# Patient Record
Sex: Female | Born: 1980 | Race: White | Hispanic: No | Marital: Married | Smoking: Never smoker
Health system: Southern US, Community
[De-identification: ages and names within clinical notes are randomized; demographics above are authoritative.]

---

## 1998-07-29 ENCOUNTER — Other Ambulatory Visit: Admission: RE | Admit: 1998-07-29 | Discharge: 1998-07-29 | Payer: Self-pay | Admitting: Obstetrics & Gynecology

## 2000-04-12 ENCOUNTER — Other Ambulatory Visit: Admission: RE | Admit: 2000-04-12 | Discharge: 2000-04-12 | Payer: Self-pay | Admitting: *Deleted

## 2000-07-19 ENCOUNTER — Encounter: Payer: Self-pay | Admitting: Obstetrics & Gynecology

## 2000-07-19 ENCOUNTER — Inpatient Hospital Stay (HOSPITAL_COMMUNITY): Admission: AD | Admit: 2000-07-19 | Discharge: 2000-07-19 | Payer: Self-pay | Admitting: Obstetrics & Gynecology

## 2000-11-16 ENCOUNTER — Inpatient Hospital Stay (HOSPITAL_COMMUNITY): Admission: AD | Admit: 2000-11-16 | Discharge: 2000-11-18 | Payer: Self-pay | Admitting: Obstetrics & Gynecology

## 2001-12-20 ENCOUNTER — Other Ambulatory Visit: Admission: RE | Admit: 2001-12-20 | Discharge: 2001-12-20 | Payer: Self-pay | Admitting: Obstetrics and Gynecology

## 2003-04-11 ENCOUNTER — Other Ambulatory Visit: Admission: RE | Admit: 2003-04-11 | Discharge: 2003-04-11 | Payer: Self-pay | Admitting: Obstetrics and Gynecology

## 2004-05-08 ENCOUNTER — Other Ambulatory Visit: Admission: RE | Admit: 2004-05-08 | Discharge: 2004-05-08 | Payer: Self-pay | Admitting: Obstetrics and Gynecology

## 2005-02-25 ENCOUNTER — Emergency Department (HOSPITAL_COMMUNITY): Admission: EM | Admit: 2005-02-25 | Discharge: 2005-02-26 | Payer: Self-pay | Admitting: *Deleted

## 2005-06-03 ENCOUNTER — Other Ambulatory Visit: Admission: RE | Admit: 2005-06-03 | Discharge: 2005-06-03 | Payer: Self-pay | Admitting: Obstetrics and Gynecology

## 2006-05-19 ENCOUNTER — Ambulatory Visit: Payer: Self-pay | Admitting: Gastroenterology

## 2006-06-09 ENCOUNTER — Ambulatory Visit: Payer: Self-pay | Admitting: Gastroenterology

## 2006-06-09 ENCOUNTER — Encounter (INDEPENDENT_AMBULATORY_CARE_PROVIDER_SITE_OTHER): Payer: Self-pay | Admitting: Specialist

## 2006-06-14 ENCOUNTER — Ambulatory Visit: Payer: Self-pay | Admitting: Internal Medicine

## 2006-07-12 ENCOUNTER — Ambulatory Visit: Payer: Self-pay | Admitting: Gastroenterology

## 2008-02-11 IMAGING — CT CT ABDOMEN W/ CM
2 of 5 series · 16 of 46 positions shown, 18 images · IV contrast (omnipaque)
Comparison: Plain film 02/26/05.

CLINICAL DATA: Chest pain and epigastric pain for one year.  Worsening.  Occasional shortness of breath.
CHEST CT WITH CONTRAST:
TECHNIQUE: Multidetector CT imaging of the chest was performed following the standard protocol during bolus administration of intravenous contrast.
Contrast:  100 cc Omnipaque 300.
TECHNIQUE: Multidetector CT imaging of the abdomen was performed following the standard protocol during bolus administration of intravenous contrast.

[Series 2: ca 5.0 b40f st · axial · 0.56mm/px · z∈[+1036,+1400]mm · 13 of 85 slices shown, 15 images]
[im 6/85  soft-tissue]
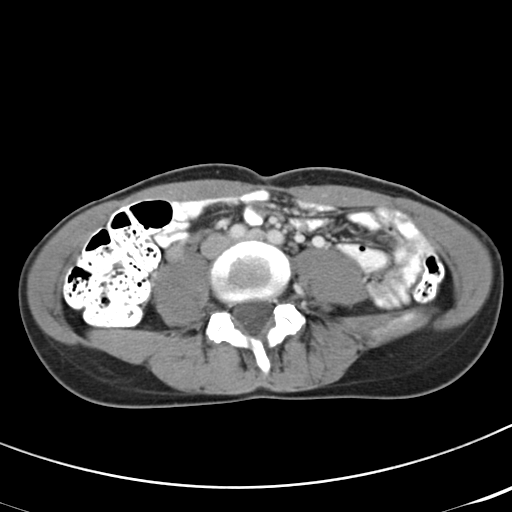
[im 6/85  bone]
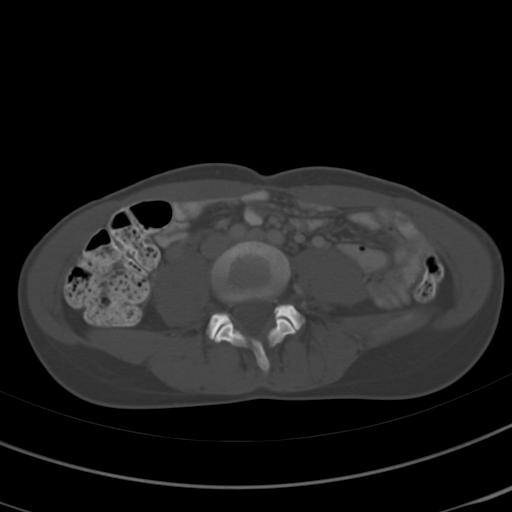
[im 12/85  soft-tissue]
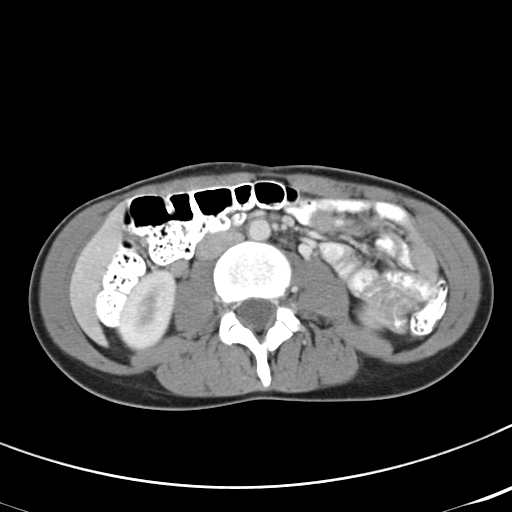
[im 17/85  soft-tissue]
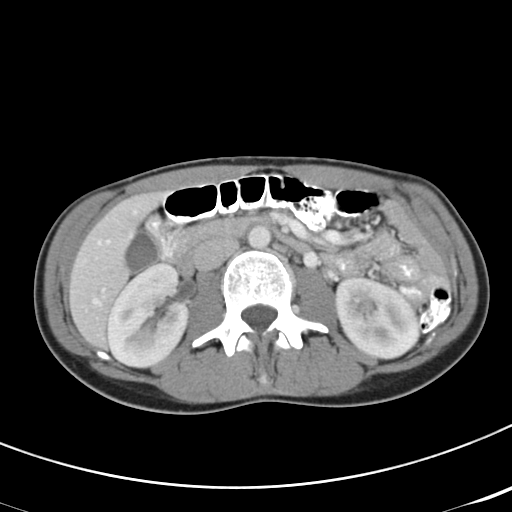
[im 23/85  soft-tissue]
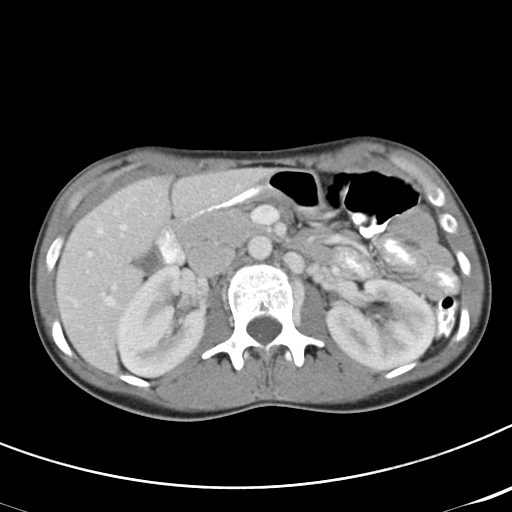
[im 29/85  soft-tissue]
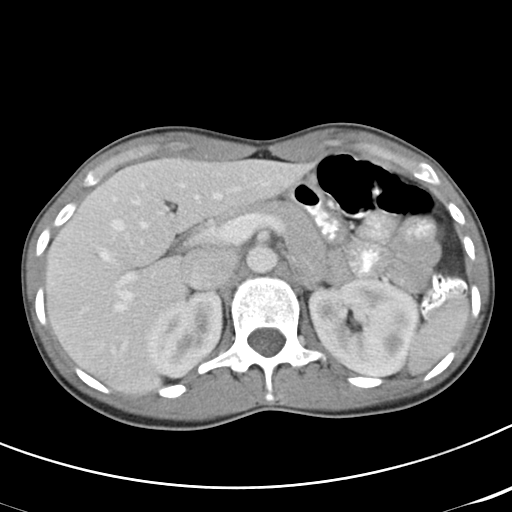
[im 34/85  soft-tissue]
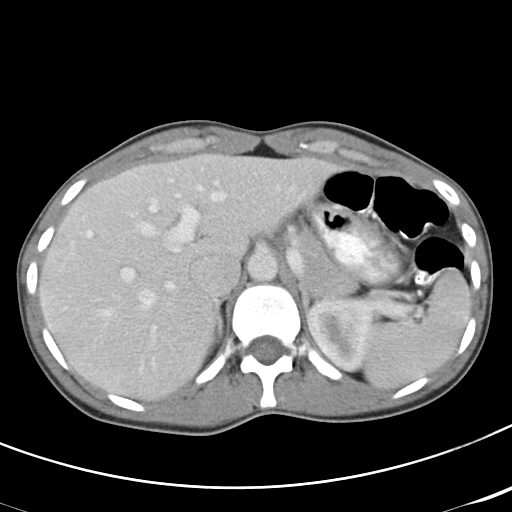
[im 45/85  soft-tissue]
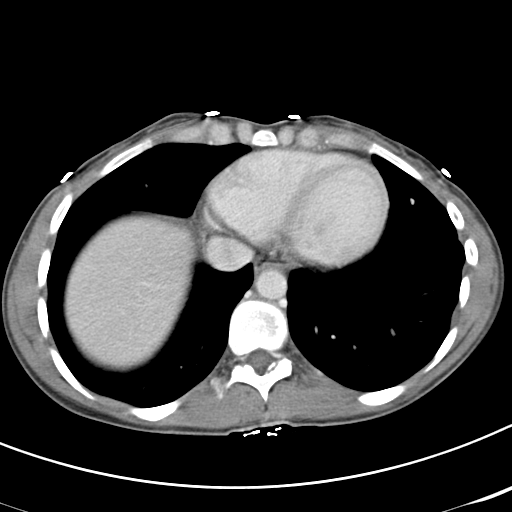
[im 51/85  soft-tissue]
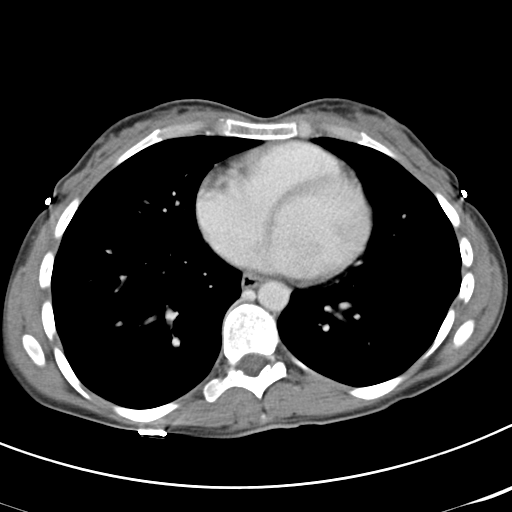
[im 57/85  soft-tissue]
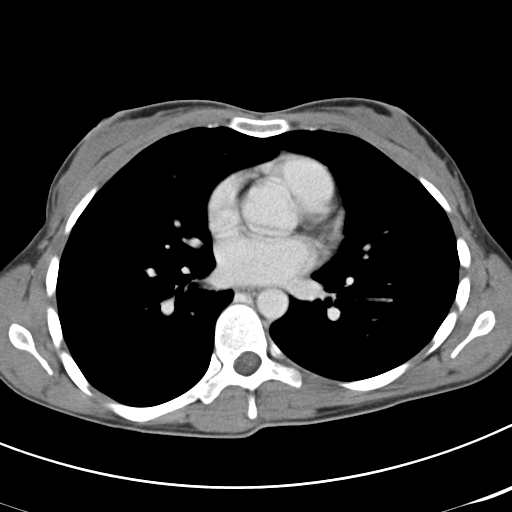
[im 57/85  bone]
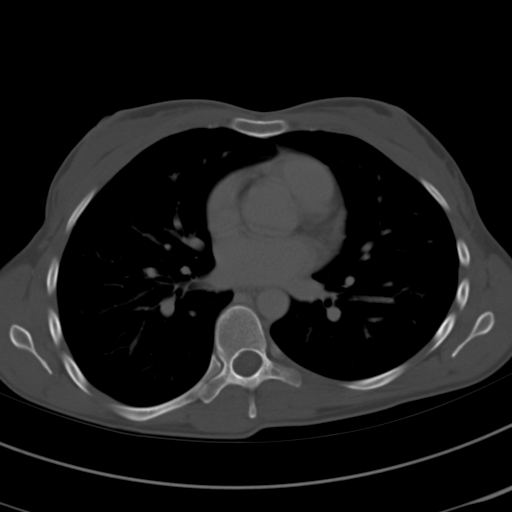
[im 62/85  soft-tissue]
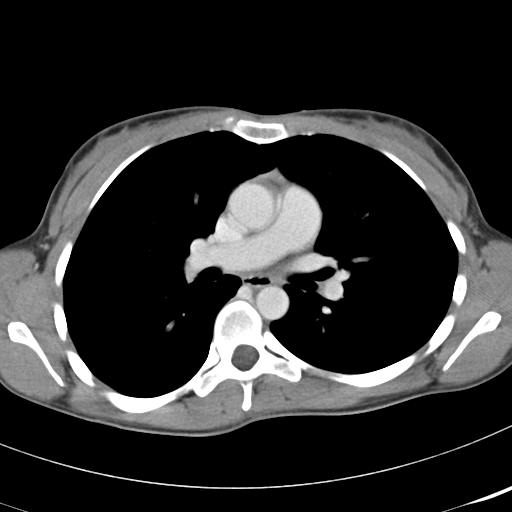
[im 68/85  soft-tissue]
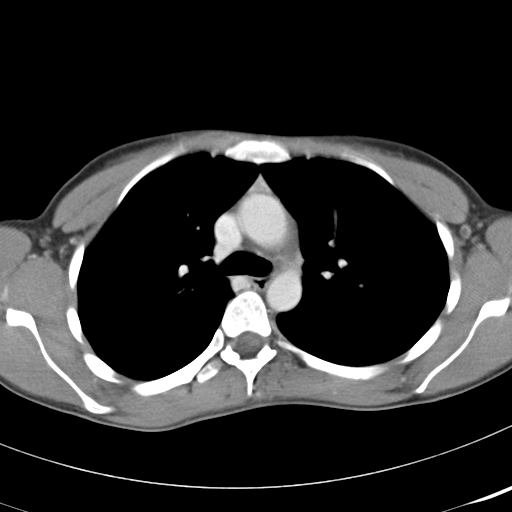
[im 73/85  soft-tissue]
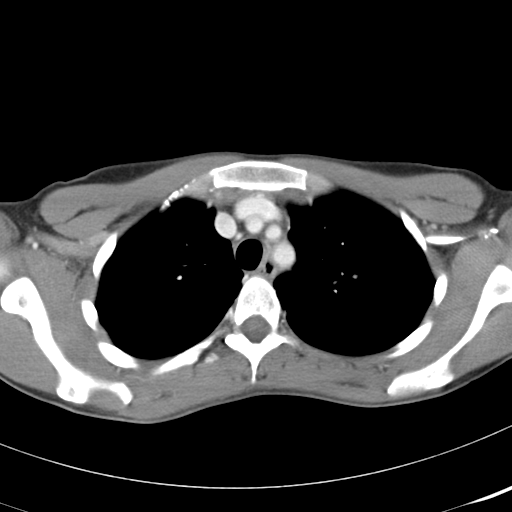
[im 79/85  soft-tissue]
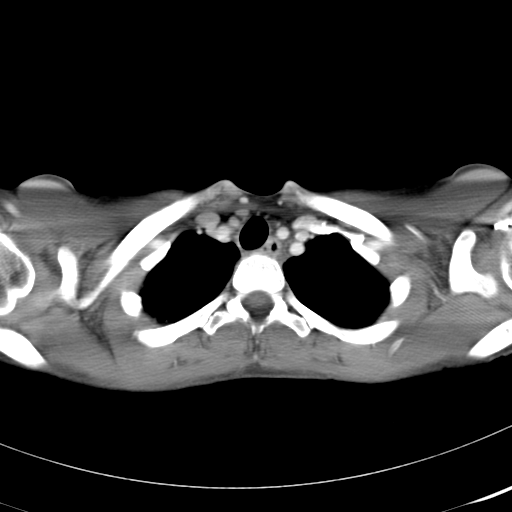

[Series 602: <mpr range> · coronal · 0.87mm/px · 3 of 48 slices shown]
[im 16/48  soft-tissue]
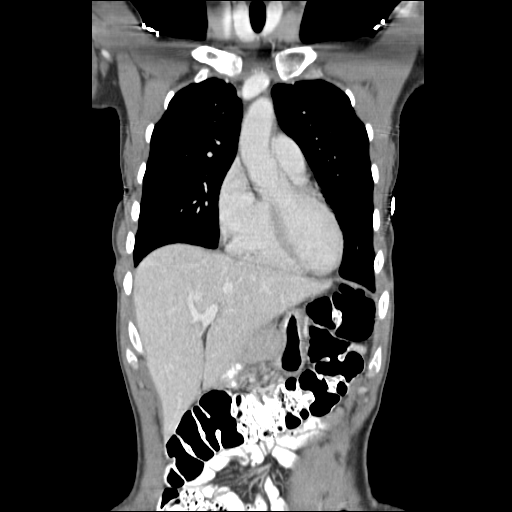
[im 21/48  soft-tissue]
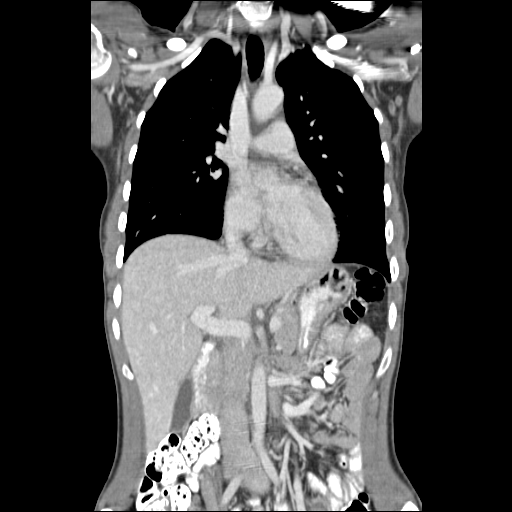
[im 27/48  soft-tissue]
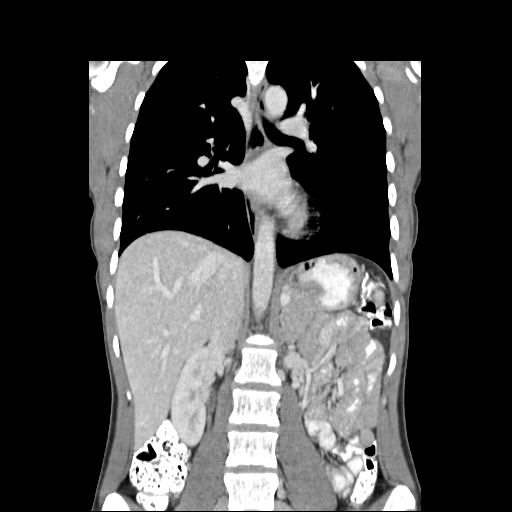

[16 of 46 positions shown; findings below may reference images not displayed]

FINDINGS: Lung windows demonstrate mild motion artifact in the upper chest on image #17 through approximately image #22.   No nodules.   No airspace disease.   
Soft tissue windows demonstrate normal caliber of the thoracic aorta without evidence of dissection.   Normal heart size.   Mild pectus excavatum deformity.   No pericardial or pleural effusion.   No mediastinal or hilar adenopathy.   
The esophagus is normal without evidence of hiatal hernia or esophageal wall thickening.   Minimal anterior mediastinal soft tissue is most consistent with residual thymus.
IMPRESSION: 1.  No acute process in the chest. 
2.  Mild pectus excavatum deformity.  Spinal curvature is mild and convex rightward.  
3.  Age-advanced osteophytes in the thoracic spine as described.  Correlate with whether patient?s symptoms could be spinal-based in nature.
ABDOMEN CT WITH CONTRAST:
FINDINGS: In the posterior RIGHT hepatic lobe, there is an approximately 6 mm hypoattenuating lesion which is too small to entirely characterize.
The spleen, stomach, pancreas, gallbladder, adrenal glands, and kidneys are normal.  There is no retroperitoneal or retrocrural adenopathy.  Abdominal large and small bowel loops are within normal limits.  The terminal ileum is not imaged.  There is suggestion of mild prominence of the gonadal veins.  No ascites.  
Bone windows demonstrate mild osseous irregularity in the right paracentral spine at T8 and in the LEFT paracentral spine at T10.  Convex right spinal curvature.
IMPRESSION: 1.  No acute process in the abdomen.
2.  Too small to characterize hypoattenuating right hepatic lobe lesion.  Presuming no history of primary malignancy or liver disease, this most likely represents a small hemangioma or cyst.
3.  Prominent gonadal veins are non-specific, but can be seen with pelvic congestion syndrome.

## 2009-01-02 ENCOUNTER — Inpatient Hospital Stay (HOSPITAL_COMMUNITY): Admission: AD | Admit: 2009-01-02 | Discharge: 2009-01-02 | Payer: Self-pay | Admitting: Obstetrics and Gynecology

## 2009-04-03 ENCOUNTER — Encounter (INDEPENDENT_AMBULATORY_CARE_PROVIDER_SITE_OTHER): Payer: Self-pay | Admitting: Obstetrics and Gynecology

## 2009-04-03 ENCOUNTER — Inpatient Hospital Stay (HOSPITAL_COMMUNITY): Admission: AD | Admit: 2009-04-03 | Discharge: 2009-04-05 | Payer: Self-pay | Admitting: Obstetrics and Gynecology

## 2010-11-17 ENCOUNTER — Inpatient Hospital Stay (HOSPITAL_COMMUNITY)
Admission: AD | Admit: 2010-11-17 | Discharge: 2010-11-17 | Disposition: A | Discharge status: Home / Self Care | Payer: BC Managed Care – PPO | Source: Ambulatory Visit | Attending: Obstetrics and Gynecology | Admitting: Obstetrics and Gynecology

## 2010-11-17 DIAGNOSIS — Z2989 Encounter for other specified prophylactic measures: Secondary | ICD-10-CM | POA: Insufficient documentation

## 2010-11-17 DIAGNOSIS — Z298 Encounter for other specified prophylactic measures: Secondary | ICD-10-CM | POA: Insufficient documentation

## 2010-11-17 DIAGNOSIS — Z3480 Encounter for supervision of other normal pregnancy, unspecified trimester: Secondary | ICD-10-CM | POA: Insufficient documentation

## 2010-11-18 LAB — RH IMMUNE GLOBULIN WORKUP (NOT WOMEN'S HOSP)
ABO/RH(D): A NEG
Antibody Screen: NEGATIVE
Unit division: 0

## 2010-12-19 ENCOUNTER — Inpatient Hospital Stay (HOSPITAL_COMMUNITY)
Admission: AD | Admit: 2010-12-19 | Discharge: 2010-12-20 | DRG: 373 | Disposition: A | Discharge status: Home / Self Care | Payer: BC Managed Care – PPO | Source: Ambulatory Visit | Attending: Obstetrics and Gynecology | Admitting: Obstetrics and Gynecology

## 2010-12-19 ENCOUNTER — Other Ambulatory Visit: Payer: Self-pay | Admitting: Obstetrics and Gynecology

## 2010-12-19 DIAGNOSIS — O34219 Maternal care for unspecified type scar from previous cesarean delivery: Principal | ICD-10-CM | POA: Diagnosis present

## 2010-12-19 DIAGNOSIS — O9903 Anemia complicating the puerperium: Secondary | ICD-10-CM | POA: Diagnosis not present

## 2010-12-19 DIAGNOSIS — D649 Anemia, unspecified: Secondary | ICD-10-CM | POA: Diagnosis not present

## 2010-12-19 LAB — CBC
HCT: 34.2 % — ABNORMAL LOW (ref 36.0–46.0)
Hemoglobin: 11 g/dL — ABNORMAL LOW (ref 12.0–15.0)
MCH: 28.4 pg (ref 26.0–34.0)
MCHC: 32.2 g/dL (ref 30.0–36.0)
MCV: 88.4 fL (ref 78.0–100.0)
Platelets: 176 10*3/uL (ref 150–400)
RBC: 3.87 MIL/uL (ref 3.87–5.11)
RDW: 14.7 % (ref 11.5–15.5)
WBC: 7.7 10*3/uL (ref 4.0–10.5)

## 2010-12-19 LAB — RPR: RPR Ser Ql: NONREACTIVE

## 2010-12-20 ENCOUNTER — Inpatient Hospital Stay (HOSPITAL_COMMUNITY): Admission: AD | Admit: 2010-12-20 | Payer: Self-pay | Admitting: Obstetrics and Gynecology

## 2010-12-20 LAB — CBC
HCT: 27.1 % — ABNORMAL LOW (ref 36.0–46.0)
MCV: 88.9 fL (ref 78.0–100.0)
RBC: 3.05 MIL/uL — ABNORMAL LOW (ref 3.87–5.11)
WBC: 10.2 10*3/uL (ref 4.0–10.5)

## 2010-12-21 LAB — CBC
HCT: 30 % — ABNORMAL LOW (ref 36.0–46.0)
HCT: 33.8 % — ABNORMAL LOW (ref 36.0–46.0)
Hemoglobin: 11.9 g/dL — ABNORMAL LOW (ref 12.0–15.0)
MCHC: 34 g/dL (ref 30.0–36.0)
MCHC: 35.2 g/dL (ref 30.0–36.0)
MCV: 99.1 fL (ref 78.0–100.0)
Platelets: 160 10*3/uL (ref 150–400)
RBC: 3.02 MIL/uL — ABNORMAL LOW (ref 3.87–5.11)
RDW: 13.5 % (ref 11.5–15.5)

## 2010-12-21 LAB — RH IMMUNE GLOB WKUP(>/=20WKS)(NOT WOMEN'S HOSP)

## 2010-12-24 LAB — RH IMMUNE GLOBULIN WORKUP (NOT WOMEN'S HOSP)
ABO/RH(D): A NEG
Antibody Screen: NEGATIVE

## 2011-01-27 NOTE — H&P (Signed)
NAMEKIERAN, NACHTIGAL             ACCOUNT NO.:  [id number]   MEDICAL RECORD NO.:  [id number]          PATIENT TYPE:  INP   LOCATION:  9131                          FACILITY:  [facility]   PHYSICIAN:  Crist Fat. Rivard, M.D. DATE OF BIRTH:  11-Jun-1981   DATE OF ADMISSION:  04/03/2009  DATE OF DISCHARGE:                              HISTORY & PHYSICAL   [name] is a 30 year old gravida 3, para 2-0-0-2 at 41-3/7 weeks who  presents today in early labor.  She reports onset of contractions at  approximately 1 p.m. today.  She denies any leaking or bleeding.  She  reports positive fetal movement.  Pregnancy has been remarkable for:  1. History of a previous C-section with a subsequent VBAC with desire      for VBAC this time.  2. History of gestational diabetes in previous pregnancy.  3. History of LGA infant with her last baby weighing 9 pounds 9      ounces.  4. History of preterm labor, but term delivery.  5. Rh negative.  6. Rubella negative.  7. Group B strep negative.   PRENATAL LABS:  Blood type is A negative, Rh antibody negative, VDRL  nonreactive, rubella titer is nonimmune.  Hepatitis B surface antigen  negative, HIV is nonreactive.  GC and chlamydia cultures were declined.  Pap was due in approximately April, but I do not see at this time where  it was performed in April.  The patient had a normal quadruple screen.  She had a 1-hour Glucola at 19 weeks which was elevated.  She had a  normal 3-hour GTT.  She then had a repeat 3-hour GTT at 28 weeks and it  was normal.  Her hemoglobin upon entry into practice was 12.0, it was  11.4 and 28 weeks.  Group B strep culture is negative at 36 weeks.   HISTORY OF PRESENT PREGNANCY:  The patient entered care at 10 weeks.  She had a quadruple screen planned.  She desired a VBAC.  Consent form  was signed in May.  She had some pain at 13 weeks.  She had an  ultrasound at 15 weeks secondary to size greater than dates that gave  her an [facility]  of March 25, 2009.  She had an early Glucola that was elevated  at 160.  She had a normal 3-hour GTT.  She had a normal ultrasound at 18  weeks with an anterior placenta noted.  VBAC consent form was signed in  May.  Due to her history of gestational diabetes with her previous  pregnancy and her elevated 1-hour, but normal 3-hour at 19 weeks, she  was given a 3-hour again at 27 weeks which was normal.  Group B strep  culture was negative at 36 weeks.  At 40-4/7 weeks, she was 2 cm.  Membranes were swept.  At 41 weeks, her cervix was unchanged.  She  declined membrane sweeping and induction at that time.  NST was reactive  in 41 weeks.  A repeat NST and AFI were done on April 04, 2009, which  were within normal limits.  She then presents today in early labor.   OBSTETRICAL HISTORY:  In 1998, she had a primary low transverse cesarean  section for a female infant, weight 6 pounds 6 ounces at 37 weeks  secondary to previa and labor onset at 37 weeks.  In 2002, she had a  vaginal VBAC of a female infant, weight 9 pounds 9 ounces of 42 weeks.  She was in labor 11 hours.  She had epidural anesthesia.  She was  treated as gestational diabetic with her first pregnancy secondary to  not being able to keep down the Glucola.  She did have a placenta previa  with that first pregnancy.   MEDICAL HISTORY:  Blood type is A negative.  She has been on Alesse in  the past.  The only other surgery was C-section in 1998.   ALLERGIES:  SHE HAS NO KNOWN MEDICATION ALLERGIES.   FAMILY HISTORY:  Paternal grandmother has adult onset diabetes.  Paternal grandfather had kidney cancer.   GENETIC HISTORY:  Remarkable for the mother of the baby's paternal aunt  and paternal uncle are twins.   SOCIAL HISTORY:  The patient is married to the father of baby.  He is  involved and supportive, his name is Felicia Morris.  The patient is  Caucasian.  She denies a religious affiliation.  She has a bachelor's  degree.  She is  employed as a Runner, broadcasting/film/video.  Her husband has a high school  education.  He is employed in Counsellor.  She has been followed by the  certified nurse midwife service at [facility].  She denies any  alcohol, drug or tobacco use during this pregnancy.   PHYSICAL EXAMINATION:  VITAL SIGNS:  Stable.  The patient is febrile.  HEENT:  Within normal limits.  LUNGS:  Breath sounds are clear.  HEART:  Regular rate and rhythm without murmur.  BREASTS:  Soft and nontender.  ABDOMEN:  Fundal height is approximately 39 weeks' size with estimated  fetal weight 8 to 8-1/2 pounds.  Uterine contractions are every 2-4  minutes, moderate quality. Cervix is 3+, 90%, vertex at a -1 station  with slight bulging bag of water.  Fetal heart rate is reactive with no  decelerations present at this time.  EXTREMITIES:  Deep tendon reflexes are 2+ without clonus.  There is  trace edema noted.   IMPRESSION:  1. Intrauterine pregnancy at 41-3/7 weeks.  2. Early labor.  3. Negative group B strep.  4. Previous cesarean section with a subsequent vaginal birth after      cesarean with desire for vaginal birth after cesarean this time.  5. Negative group B strep.  6. Rh negative.   PLAN:  1. Admit to birthing suite for consult with Dr. Silverio Lay as      attending physician.  2. Routine certified nurse midwife orders.  3. The patient at present prefers to decline epidural, however, she      may decide to proceed with this as labor progresses.  4. The patient understands the risks and benefits of VBAC, including      uterine rupture, lack of progress requiring a repeat cesarean,      history of LGA infant, possibility of shoulder dystocia.  The      patient seemed to understand all these risks and others and wishes      to proceed with VBAC trial.      Chip Boer L. Emilee Hero, C.N.M.      Crist Fat Rivard,  M.D.  Electronically Signed   VLL/MEDQ  D:  04/04/2009  T:  04/04/2009  Job:  [id number]

## 2011-01-30 NOTE — Assessment & Plan Note (Signed)
[facility]                           GASTROENTEROLOGY OFFICE NOTE   NAME:JARRELLShaianne, Nucci                    MRN:          [id number]  DATE:05/19/2006                            DOB:          1981-09-07    REASON FOR REFERRAL:  Dr. Christell Constant asked me to evaluate Ms. Bardon in  consultation regarding globus sensation, dysphagia, chest pain, and  abdominal pain.   Ms. Babich is a very pleasant 30 year old woman who has had a constellation  of GI symptoms for approximately one year.  First, she has intermittent  solid food dysphagia.  She has never had overt food impaction but does feel  that, at times, food does seem to hang-up in her mid esophagus.  Liquids  have no troubles.  Secondly, she has a feeling of a lump in her throat.  This is a constant feeling of a lump in her throat that is more noticeable  when she takes a swallow.  This has been going on for approximately a year  but she thinks it has been more prominent in the past several months.  She  also has a retrosternal chest discomfort that she describes as a pressure-  like sensation, laying on her side tends to worsen it but nothing seems to  bring it on reliably.  She also has an epigastric discomfort, which she  describes as intermittent pressure pain.  This is not related to eating or  moving her bowels.  She was started on Nexium approximately a month ago and  this was recently doubled two weeks ago.  She has noticed no different on  this, although she does take it 1-2 hours before meals rather than 20-30  minutes before a meal.  She was tried on several different anti-anxiety  medicines in the past year or so and has noticed no change in her symptoms  with those.  She has had recent lab tests and imaging studies done.  Specifically July 2007, she had a normal complete metabolic profile.  Normal  thyroid study.  She also had a CT of her head for headaches in August 2007  and this was  normal.  She had an abdominal ultrasound April 16, 2006 that  was normal with a normal gallbladder, no dilated ducts, normal liver.  Normal common bile duct.   She does not believe that she has had any dramatic stresses in her life,  although she has recently started back to work.  This has not been a tough  transition for her and her symptoms do seem to predate this.   REVIEW OF SYSTEMS:  Notable for stable weight.  Otherwise essentially normal  and is available on the nursing intake sheet.   PAST MEDICAL HISTORY:  Chronic headaches.  She is undergoing neurologic  evaluation soon.  Status post C-section.   CURRENT MEDICATIONS:  Birth control pills, Nexium.   ALLERGIES:  No known drug allergies.   SOCIAL HISTORY:  Married, two sons, ages 8 and 19.  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  Non-smoker, non-drinker.   FAMILY HISTORY:  Grandmother and uncle with alcoholism.  No colon cancer,  colon polyps in the family.   PHYSICAL EXAMINATION:  VITAL SIGNS:  5 feet 3 inches, 108 pounds, blood  pressure 102/60, pulse 68.  CONSTITUTIONAL:  Somewhat thin but otherwise well appearing.  NEUROLOGICAL:  Alert and oriented x3.  EYES:  Extraocular movements intact.  MOUTH:  Oropharynx moist, no lesions.  NECK:  Supple, no lymphadenopathy.  HEART:  Regular rate and rhythm.  LUNGS:  Clear to auscultation bilaterally.  ABDOMEN:  Soft, nontender, nondistended, normal bowel sounds.  EXTREMITIES:  No lower extremity edema.  SKIN:  No rashes or lesions on extremities.   ASSESSMENT AND PLAN:  A 30 year old woman with a constellation of upper  gastrointestinal symptoms.   I suspect many of her symptoms are functional, given normal lab tests and  normal recent imaging studies.  That being said, I think we should proceed  with an EGD given her intermittent dysphagia, perhaps she does have  stricturing disease or significant reflux esophagitis.  Some of her   abdominal discomforts may be explainable by a peptic ulcer.  Globus is a  very hard symptom to explain fully and even harder to get rid of.  Overall,  with her normal weight and normal labs, I suspect many of her symptoms are  functional.  We will arrange for her to have an EGD at her soonest  convenience and I recommended that she change the way she is taking the  Protonix to 20 minutes before a meal rather than 1-2 hours before a meal.                                   Rachael Fee, MD   DPJ/MedQ  DD:  05/19/2006  DT:  05/20/2006  Job #:  [id number]   cc:   Ernestina Penna, M.D.

## 2011-01-30 NOTE — Assessment & Plan Note (Signed)
[facility]                           GASTROENTEROLOGY OFFICE NOTE   NAME:JARRELLCorah, Willeford                    MRN:          [id number]  DATE:07/12/2006                            DOB:          [birth year]    PRIMARY CARE PHYSICIAN:  Ernestina Penna, M.D.   GI PROBLEM LIST:  1. Intermittent solid food dysphagia.  Retrosternal chest pain also      accompanies this.  2. Esophagogastroduodenoscopy September, 2007, normal.   INTERVAL HISTORY:  I saw Kiera at the time of her upper endoscopy  approximately 1 month ago.  At that time it did not seem that Nexium was  making any difference with her symptoms.  She was still having globus chest  and epigastric pains so I arranged for her to have a chest CT scan.  This  does show some thoracic osteophytes that are age-advanced.  It is not clear  if these are the cause of all of her symptoms, but it really is the only  thing we have to go on.  Today she is describing some tingling in her arms,  especially when they are in certain positions, raised above her head, etc.   PHYSICAL EXAM:  Weight 110 pounds, blood pressure 110/60, pulse 80.  CONSTITUTIONAL:  Well appearing.  ABDOMEN:  Soft, nontender, nondistended, normal bowel sounds.   ASSESSMENT AND PLAN:  A 30 year old woman with globus, chest, back, arm  symptoms.   There is a wide variety of symptoms.  It is not clear if these are GI in  nature.  An EGD certainly was normal.  A CT scan found some age-advanced  osteophytes in her thoracic spine and I wonder whether this is possibly  contributing to some of her symptoms, I am arranging for her to see a  neurologist to discuss this.  Certainly, if the neurologists or her primary  care physician feels that this is not a spinal problem, the next GI test  that I would proceed with is an esophageal motility study.    ______________________________  Rachael Fee, MD    DPJ/MedQ  DD: 07/12/2006  DT:  07/13/2006  Job #: [id number]   cc:   Ernestina Penna, M.D.

## 2011-01-30 NOTE — H&P (Signed)
[facility]  Patient:    Felicia Morris, Felicia Morris                      MRN: [id number] Adm. Date:  11/16/00 Attending:  Erie Noe P. Pennie Rushing, M.D. Dictator:   Miguel Dibble, C.N.M.                         History and Physical  DATE OF BIRTH:                02-Oct-1980  HISTORY:                      This is a 30 year old gravida 2, para 1, at 41-4/[redacted] weeks pregnant, who presented in the office today 3 cm, 90% effaced, vertex at -1, who had been contracting for the previous five hours, and her cervix had changed from 1 cm to 3 cm.  She had had a previous cesarean section for placenta previa and desired a trial of labor and vaginal birth.  This pregnancy was significant for its negative status for which she received RhoGAM at 28 weeks, rubella nonimmune, negative group B strep.  PRENATAL LABS:                At entry into the practice: hemoglobin 13.7, hematocrit 38.4, platelets 263, blood type ______  A negative, Rh antibodies negative. VDRL nonreactive.  Rubella titer nonimmune.  Hepatitis C surface antigen negative.  HIV nonreactive.  Pap smear within normal limits. Gonorrhea and Chlamydia cultures negative.  Maternal serum alpha-fetoprotein within normal limits.  Glucose trial and stressor at 28 weeks within normal limits.  Group beta strep at 36 weeks negative.  Ultrasound reveals normal anatomy and placenta with EDC of November 06, 1999.  MEDICAL HISTORY:              No known drug allergies.  Usual childhood diseases.  Fractured left thumb in the past.  Discontinued smoking five years ago.  FAMILY HISTORY:               Paternal grandmother with hypertension and diabetes.  Cousin with cancer of unknown origin as a child.  Paternal grandmother with aneurysm.  GENETIC HISTORY:              Significant for twins.  SOCIAL HISTORY:               Caucasian, Baptist religion, married to Almendra Loria, high school graduate, works in Environmental consultant.   Father of the baby is a high Garment/textile technologist and works as an Mudlogger. Stable, monogamous relationship.  Denies smoking, alcohol, or drug abuse.  OBSTETRICAL HISTORY:          July 1998, cesarean section of a female delivered, 6 pounds 6 ounces at 37 weeks.  Cesarean section for placenta previa.  This pregnancy was complicated by preterm labor with hospitalization for one month on magnesium sulfate therapy.  Also, this pregnancy was complicated by gestational diabetes diet-controlled.  Baby after delivery was in NICU for two weeks for respiratory illnesses.  This is her second pregnancy.  PHYSICAL EXAMINATION:  HEENT:                        Within normal limits.  LUNGS:  Bilaterally clear.  HEART:                        Regular rate and rhythm.  ABDOMEN;                      Soft, nontender.  Contractions every 3 minutes. Fetal heart rate is reassuring.  PELVIC:                       Cervix is 3 cm, 90% effaced, vertex at -1.  EXTREMITIES:                  Trace edema.  Deep tendon reflexes +1.  ASSESSMENT:                   Multip, previous cesarean section, desires trial of labor.  Rubella nonimmune.  Rh negative.  Negative group B streptococcus.  PLAN:                         Admit to labor and delivery.  Routine [facility] orders.  Epidural p.r.n.  Anticipate normal spontaneous vaginal delivery.  Notify Dr. Pennie Rushing of admission and status. DD:  11/16/00 TD:  11/16/00 Job: [id number]

## 2015-06-05 ENCOUNTER — Emergency Department (HOSPITAL_COMMUNITY)
Admission: EM | Admit: 2015-06-05 | Discharge: 2015-06-05 | Disposition: A | Discharge status: Home / Self Care | Payer: 59 | Attending: Emergency Medicine | Admitting: Emergency Medicine

## 2015-06-05 ENCOUNTER — Emergency Department (HOSPITAL_COMMUNITY): Payer: 59

## 2015-06-05 ENCOUNTER — Encounter (HOSPITAL_COMMUNITY): Payer: Self-pay | Admitting: Family Medicine

## 2015-06-05 DIAGNOSIS — W311XXA Contact with metalworking machines, initial encounter: Secondary | ICD-10-CM | POA: Insufficient documentation

## 2015-06-05 DIAGNOSIS — Y998 Other external cause status: Secondary | ICD-10-CM | POA: Diagnosis not present

## 2015-06-05 DIAGNOSIS — Y9389 Activity, other specified: Secondary | ICD-10-CM | POA: Insufficient documentation

## 2015-06-05 DIAGNOSIS — S61212A Laceration without foreign body of right middle finger without damage to nail, initial encounter: Secondary | ICD-10-CM | POA: Insufficient documentation

## 2015-06-05 DIAGNOSIS — Y9289 Other specified places as the place of occurrence of the external cause: Secondary | ICD-10-CM | POA: Diagnosis not present

## 2015-06-05 DIAGNOSIS — Z23 Encounter for immunization: Secondary | ICD-10-CM | POA: Diagnosis not present

## 2015-06-05 DIAGNOSIS — S61218A Laceration without foreign body of other finger without damage to nail, initial encounter: Secondary | ICD-10-CM

## 2015-06-05 MED ORDER — TETANUS-DIPHTH-ACELL PERTUSSIS 5-2.5-18.5 LF-MCG/0.5 IM SUSP
0.5000 mL | Freq: Once | INTRAMUSCULAR | Status: AC
  Administered 2015-06-05: 0.5 mL via INTRAMUSCULAR
  Filled 2015-06-05: qty 0.5

## 2015-06-05 MED ORDER — LIDOCAINE HCL 2 % IJ SOLN
10.0000 mL | Freq: Once | INTRAMUSCULAR | Status: AC
  Administered 2015-06-05: 200 mg via INTRADERMAL
  Filled 2015-06-05: qty 10

## 2015-06-05 NOTE — Discharge Instructions (Signed)
Laceration Care, Adult Return for any swelling or drainage from the wound. Have the sutures removed in 7 days. Keep the wound clean and dry. Follow-up with [facility]. A laceration is a cut or lesion that goes through all layers of the skin and into the tissue just beneath the skin. TREATMENT  Some lacerations may not require closure. Some lacerations may not be able to be closed due to an increased risk of infection. It is important to see your caregiver as soon as possible after an injury to minimize the risk of infection and maximize the opportunity for successful closure. If closure is appropriate, pain medicines may be given, if needed. The wound will be cleaned to help prevent infection. Your caregiver will use stitches (sutures), staples, wound glue (adhesive), or skin adhesive strips to repair the laceration. These tools bring the skin edges together to allow for faster healing and a better cosmetic outcome. However, all wounds will heal with a scar. Once the wound has healed, scarring can be minimized by covering the wound with sunscreen during the day for 1 full year. HOME CARE INSTRUCTIONS  For sutures or staples:  Keep the wound clean and dry.  If you were given a bandage (dressing), you should change it at least once a day. Also, change the dressing if it becomes wet or dirty, or as directed by your caregiver.  Wash the wound with soap and water 2 times a day. Rinse the wound off with water to remove all soap. Pat the wound dry with a clean towel.  After cleaning, apply a thin layer of the antibiotic ointment as recommended by your caregiver. This will help prevent infection and keep the dressing from sticking.  You may shower as usual after the first 24 hours. Do not soak the wound in water until the sutures are removed.  Only take over-the-counter or prescription medicines for pain, discomfort, or fever as directed by your caregiver.  Get your sutures or staples  removed as directed by your caregiver. For skin adhesive strips:  Keep the wound clean and dry.  Do not get the skin adhesive strips wet. You may bathe carefully, using caution to keep the wound dry.  If the wound gets wet, pat it dry with a clean towel.  Skin adhesive strips will fall off on their own. You may trim the strips as the wound heals. Do not remove skin adhesive strips that are still stuck to the wound. They will fall off in time. For wound adhesive:  You may briefly wet your wound in the shower or bath. Do not soak or scrub the wound. Do not swim. Avoid periods of heavy perspiration until the skin adhesive has fallen off on its own. After showering or bathing, gently pat the wound dry with a clean towel.  Do not apply liquid medicine, cream medicine, or ointment medicine to your wound while the skin adhesive is in place. This may loosen the film before your wound is healed.  If a dressing is placed over the wound, be careful not to apply tape directly over the skin adhesive. This may cause the adhesive to be pulled off before the wound is healed.  Avoid prolonged exposure to sunlight or tanning lamps while the skin adhesive is in place. Exposure to ultraviolet light in the first year will darken the scar.  The skin adhesive will usually remain in place for 5 to 10 days, then naturally fall off the skin. Do not pick at  the adhesive film. You may need a tetanus shot if:  You cannot remember when you had your last tetanus shot.  You have never had a tetanus shot. If you get a tetanus shot, your arm may swell, get red, and feel warm to the touch. This is common and not a problem. If you need a tetanus shot and you choose not to have one, there is a rare chance of getting tetanus. Sickness from tetanus can be serious. SEEK MEDICAL CARE IF:   You have redness, swelling, or increasing pain in the wound.  You see a red line that goes away from the wound.  You have  yellowish-white fluid (pus) coming from the wound.  You have a fever.  You notice a bad smell coming from the wound or dressing.  Your wound breaks open before or after sutures have been removed.  You notice something coming out of the wound such as wood or glass.  Your wound is on your hand or foot and you cannot move a finger or toe. SEEK IMMEDIATE MEDICAL CARE IF:   Your pain is not controlled with prescribed medicine.  You have severe swelling around the wound causing pain and numbness or a change in color in your arm, hand, leg, or foot.  Your wound splits open and starts bleeding.  You have worsening numbness, weakness, or loss of function of any joint around or beyond the wound.  You develop painful lumps near the wound or on the skin anywhere on your body. MAKE SURE YOU:   Understand these instructions.  Will watch your condition.  Will get help right away if you are not doing well or get worse. Document Released: 08/31/2005 Document Revised: 11/23/2011 Document Reviewed: 02/24/2011 [facility] Patient Information 2015 [place], [place]. This information is not intended to replace advice given to you by your health care provider. Make sure you discuss any questions you have with your health care provider.

## 2015-06-05 NOTE — ED Notes (Addendum)
Pt here for lac to the tip of right middle finger. sts cut with table saw. Bleeding controlled. Able to move finger and feeling in tact.

## 2015-06-05 NOTE — ED Provider Notes (Signed)
CSN: [id number]     Arrival date & time 06/05/15  1707 History  This chart was scribed for non-physician practitioner Catha Gosselin, PA-C, working with Mirian Mo, MD, by Tanda Rockers, ED Scribe. This patient was seen in room TR07C/TR07C and the patient's care was started at 5:59 PM.  Chief Complaint  Patient presents with  . Laceration   The history is provided by the patient. No language interpreter was used.     HPI Comments: Felicia Morris is a 34 y.o. female who is right hand dominant presents to the Emergency Department complaining of laceration to right middle finger from a table saw that occurred around 4:30 PM today (approximately 1.5 hours ago). Bleeding is controlled currently. Pt notes increased pain to the area. She has taken Ibuprofen with relief. Pt's last tetanus shot vaccination in 2007. She is not currently on any anti coagulants. Denies weakness, numbness, tingling, or any other associated symptoms.    History reviewed. No pertinent past medical history. History reviewed. No pertinent past surgical history. History reviewed. No pertinent family history. Social History  Substance Use Topics  . Smoking status: Never Smoker   . Smokeless tobacco: None  . Alcohol Use: None   OB History    No data available     Review of Systems  Musculoskeletal: Positive for arthralgias.  Skin: Positive for wound.  Neurological: Negative for weakness and numbness.   Allergies  Review of patient's allergies indicates no known allergies.  Home Medications   Prior to Admission medications   Not on File   Triage Vitals: BP 118/86 mmHg  Pulse 87  Temp(Src) 98.1 F (36.7 C)  Resp 18  Ht  (1.6 m)  Wt 120 lb (54.432 kg)  BMI 21.26 kg/m2  SpO2 98%  LMP 05/22/2015   Physical Exam  Constitutional: She is oriented to person, place, and time. She appears well-developed and well-nourished. No distress.  HENT:  Head: Normocephalic and atraumatic.  Eyes:  Conjunctivae and EOM are normal.  Neck: Neck supple. No tracheal deviation present.  Cardiovascular: Normal rate.   Pulmonary/Chest: Effort normal. No respiratory distress.  Musculoskeletal: Normal range of motion.  1.5 cm distal right middle finger laceration along the volar surface with fat exposure.  2+ radial pulse Able to flex and extend finger without difficulty NVI   Neurological: She is alert and oriented to person, place, and time.  Skin: Skin is warm and dry.  Psychiatric: She has a normal mood and affect. Her behavior is normal.  Nursing note and vitals reviewed.   ED Course  Procedures (including critical care time)  DIAGNOSTIC STUDIES: Oxygen Saturation is 98% on RA, normal by my interpretation.    COORDINATION OF CARE: 6:02 PM-Discussed treatment plan which includes DG R Hand, Tdap, and suture placement with pt at bedside and pt agreed to plan. Patient refused pain medication.  LACERATION REPAIR Performed by: Catha Gosselin Authorized by: Catha Gosselin Consent: Verbal consent obtained. Risks and benefits: risks, benefits and alternatives were discussed Consent given by: patient Patient identity confirmed: provided demographic data Prepped and Draped in normal sterile fashion Wound explored Laceration Location: distal right middle finger, volar surface Laceration Length: 1.5cm No Foreign Bodies seen or palpated Anesthesia: local infiltration Local anesthetic: lidocaine 2% without epinephrine Anesthetic total: 2 ml Irrigation method: syringe and soak Amount of cleaning: standard Technique: Bleeding controlled. No foreign bodies palpated. Xeroform and dressing was applied since wound could not be approximated and there was not enough skin for closure  Patient tolerance: Patient tolerated the procedure well with no immediate complications.  Labs Review Labs Reviewed - No data to display  Imaging Review Dg Finger Middle Right  06/05/2015   CLINICAL  DATA:  Laceration to the right third digit with a table saw  EXAM: RIGHT MIDDLE FINGER 2+V  COMPARISON:  None.  FINDINGS: There is no evidence of fracture or dislocation. There is no evidence of arthropathy or other focal bone abnormality. Soft tissues are unremarkable.  IMPRESSION: Negative.   Electronically Signed   By: Christiana Pellant M.D.   On: 06/05/2015 19:40      EKG Interpretation None      MDM   Final diagnoses:  Laceration of middle finger, initial encounter   Patient presents for right middle finger laceration with table saw. The finger was soaked in peroxide and saline. X-ray of the finger shows no fracture or dislocation. Xeroform and dressing was applied.  I discussed return precautions and follow-up with the patient. She verbally agrees with the plan.  She can take ibuprofen for pain. Medications  lidocaine (XYLOCAINE) 2 % (with pres) injection 200 mg (not administered)  Tdap (BOOSTRIX) injection 0.5 mL (0.5 mLs Intramuscular Given 06/05/15 1807)   I personally performed the services described in this documentation, which was scribed in my presence. The recorded information has been reviewed and is accurate.      Catha Gosselin, PA-C 06/05/15 2043  Mirian Mo, MD 06/07/15 [phone]

## 2015-06-05 NOTE — ED Notes (Signed)
Patient currently soaking finger in peroxide and saline.  Will continue to monitor

## 2017-02-01 IMAGING — DX DG FINGER MIDDLE 2+V*R*
3 series · 3 of 3 positions shown · non-contrast
Comparison: None.

CLINICAL DATA: Laceration to the right third digit with a table saw

EXAM:
RIGHT MIDDLE FINGER 2+V

[finger ap]
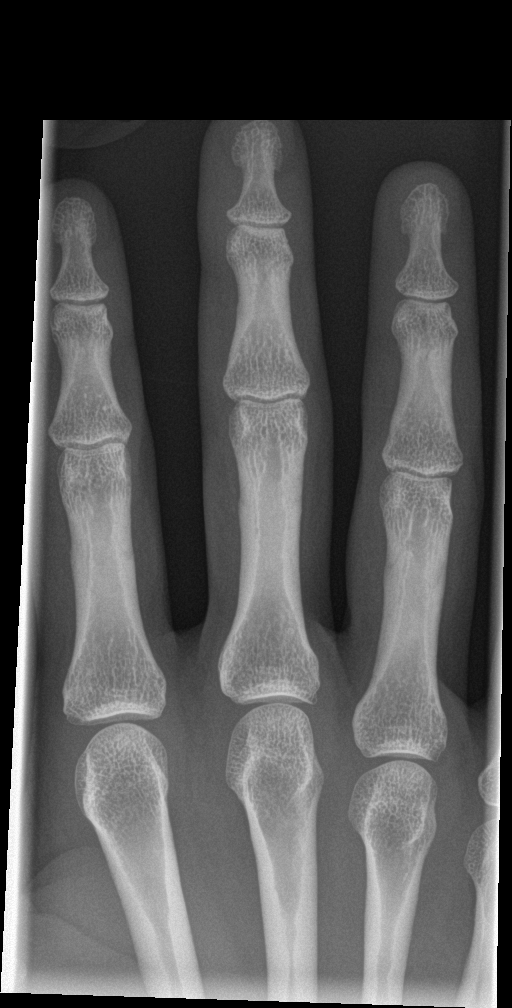

[finger obl]
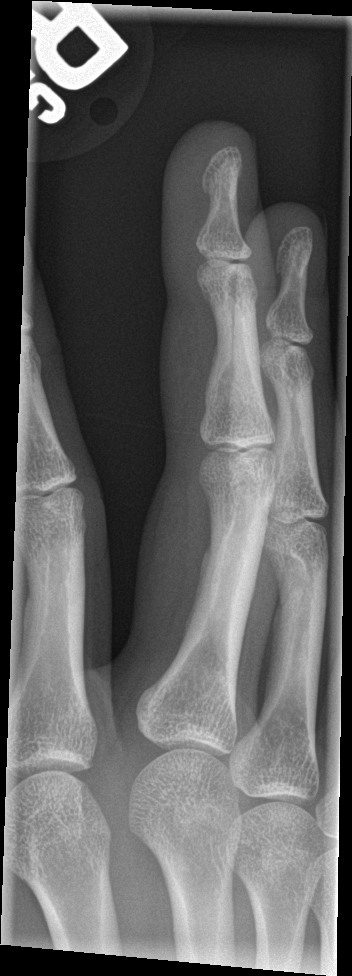

[finger lat]
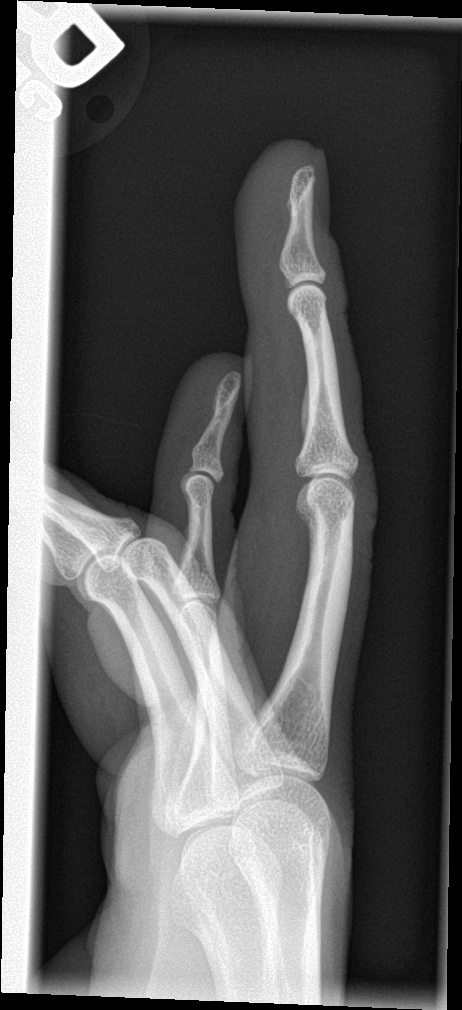

[3 of 3 positions shown; findings below may reference images not displayed]

FINDINGS: There is no evidence of fracture or dislocation. There is no
evidence of arthropathy or other focal bone abnormality. Soft
tissues are unremarkable.
IMPRESSION: Negative.
